# Patient Record
Sex: Male | Born: 1955 | Race: White | Hispanic: No | Marital: Married | Smoking: Former smoker
Health system: Southern US, Community
[De-identification: ages and names within clinical notes are randomized; demographics above are authoritative.]

---

## 2010-10-24 ENCOUNTER — Inpatient Hospital Stay (HOSPITAL_COMMUNITY)
Admission: EM | Admit: 2010-10-24 | Discharge: 2010-11-03 | DRG: 330 | Disposition: A | Discharge status: Home / Self Care | Payer: Self-pay | Attending: General Surgery | Admitting: General Surgery

## 2010-10-24 ENCOUNTER — Ambulatory Visit (INDEPENDENT_AMBULATORY_CARE_PROVIDER_SITE_OTHER): Payer: Self-pay

## 2010-10-24 ENCOUNTER — Inpatient Hospital Stay (INDEPENDENT_AMBULATORY_CARE_PROVIDER_SITE_OTHER)
Admission: RE | Admit: 2010-10-24 | Discharge: 2010-10-24 | Disposition: A | Discharge status: Home / Self Care | Payer: Self-pay | Source: Ambulatory Visit | Attending: Family Medicine | Admitting: Family Medicine

## 2010-10-24 ENCOUNTER — Emergency Department (HOSPITAL_COMMUNITY): Payer: Self-pay

## 2010-10-24 DIAGNOSIS — K5732 Diverticulitis of large intestine without perforation or abscess without bleeding: Principal | ICD-10-CM | POA: Diagnosis present

## 2010-10-24 DIAGNOSIS — K56609 Unspecified intestinal obstruction, unspecified as to partial versus complete obstruction: Secondary | ICD-10-CM

## 2010-10-24 DIAGNOSIS — D126 Benign neoplasm of colon, unspecified: Secondary | ICD-10-CM | POA: Diagnosis present

## 2010-10-24 DIAGNOSIS — K5669 Other intestinal obstruction: Secondary | ICD-10-CM | POA: Diagnosis present

## 2010-10-24 DIAGNOSIS — E876 Hypokalemia: Secondary | ICD-10-CM | POA: Diagnosis not present

## 2010-10-24 DIAGNOSIS — R109 Unspecified abdominal pain: Secondary | ICD-10-CM

## 2010-10-24 DIAGNOSIS — J209 Acute bronchitis, unspecified: Secondary | ICD-10-CM | POA: Diagnosis not present

## 2010-10-24 DIAGNOSIS — F17200 Nicotine dependence, unspecified, uncomplicated: Secondary | ICD-10-CM | POA: Diagnosis present

## 2010-10-24 LAB — COMPREHENSIVE METABOLIC PANEL
Albumin: 3.3 g/dL — ABNORMAL LOW (ref 3.5–5.2)
Alkaline Phosphatase: 61 U/L (ref 39–117)
BUN: 11 mg/dL (ref 6–23)
Calcium: 8.8 mg/dL (ref 8.4–10.5)
Creatinine, Ser: 0.74 mg/dL (ref 0.50–1.35)
GFR calc Af Amer: >90 mL/min (ref >90)
Glucose, Bld: 101 mg/dL — ABNORMAL HIGH (ref 70–99)
Total Protein: 6.5 g/dL (ref 6.0–8.3)

## 2010-10-24 LAB — POCT URINALYSIS DIP (DEVICE)
Glucose, UA: NEGATIVE mg/dL
Hgb urine dipstick: NEGATIVE
Nitrite: NEGATIVE
Specific Gravity, Urine: >=1.03 (ref 1.005–1.030)
Urobilinogen, UA: 1 mg/dL (ref 0.0–1.0)
pH: 6 (ref 5.0–8.0)

## 2010-10-24 LAB — LIPASE, BLOOD: Lipase: 54 U/L (ref 11–59)

## 2010-10-24 LAB — URINALYSIS, ROUTINE W REFLEX MICROSCOPIC
Bilirubin Urine: NEGATIVE
Leukocytes, UA: NEGATIVE
Nitrite: NEGATIVE
Specific Gravity, Urine: 1.02 (ref 1.005–1.030)
Urobilinogen, UA: 1 mg/dL (ref 0.0–1.0)

## 2010-10-24 LAB — DIFFERENTIAL
Eosinophils Relative: 1 % (ref 0–5)
Lymphocytes Relative: 9 % — ABNORMAL LOW (ref 12–46)
Monocytes Absolute: 0.7 10*3/uL (ref 0.1–1.0)
Monocytes Relative: 6 % (ref 3–12)
Neutro Abs: 9.7 10*3/uL — ABNORMAL HIGH (ref 1.7–7.7)

## 2010-10-24 LAB — CBC
HCT: 44.5 % (ref 39.0–52.0)
Hemoglobin: 15.7 g/dL (ref 13.0–17.0)
MCH: 32.4 pg (ref 26.0–34.0)
MCHC: 35.3 g/dL (ref 30.0–36.0)
RDW: 12.4 % (ref 11.5–15.5)

## 2010-10-24 MED ORDER — IOHEXOL 300 MG/ML  SOLN
100.0000 mL | Freq: Once | INTRAMUSCULAR | Status: AC | PRN
  Administered 2010-10-24: 100 mL via INTRAVENOUS

## 2010-10-25 ENCOUNTER — Other Ambulatory Visit: Payer: Self-pay | Admitting: Gastroenterology

## 2010-10-25 LAB — TYPE AND SCREEN: Antibody Screen: NEGATIVE

## 2010-10-25 LAB — BASIC METABOLIC PANEL
BUN: 9 mg/dL (ref 6–23)
GFR calc Af Amer: >90 mL/min (ref >90)
GFR calc non Af Amer: >90 mL/min (ref >90)
Potassium: 3.3 mEq/L — ABNORMAL LOW (ref 3.5–5.1)

## 2010-10-25 LAB — MAGNESIUM: Magnesium: 1.8 mg/dL (ref 1.5–2.5)

## 2010-10-25 LAB — PROTIME-INR: Prothrombin Time: 13.1 seconds (ref 11.6–15.2)

## 2010-10-25 LAB — OCCULT BLOOD, POC DEVICE: Fecal Occult Bld: NEGATIVE

## 2010-10-26 LAB — COMPREHENSIVE METABOLIC PANEL
AST: 9 U/L (ref 0–37)
Albumin: 2.8 g/dL — ABNORMAL LOW (ref 3.5–5.2)
Alkaline Phosphatase: 50 U/L (ref 39–117)
Chloride: 103 mEq/L (ref 96–112)
Potassium: 3.2 mEq/L — ABNORMAL LOW (ref 3.5–5.1)
Total Bilirubin: 0.9 mg/dL (ref 0.3–1.2)

## 2010-10-26 LAB — CBC
HCT: 41.1 % (ref 39.0–52.0)
Platelets: 232 10*3/uL (ref 150–400)
RDW: 12.4 % (ref 11.5–15.5)
WBC: 10.3 10*3/uL (ref 4.0–10.5)

## 2010-10-27 ENCOUNTER — Inpatient Hospital Stay (HOSPITAL_COMMUNITY): Payer: Self-pay

## 2010-10-28 ENCOUNTER — Inpatient Hospital Stay (HOSPITAL_COMMUNITY): Payer: Self-pay

## 2010-10-28 LAB — BASIC METABOLIC PANEL
Calcium: 8.5 mg/dL (ref 8.4–10.5)
GFR calc Af Amer: >90 mL/min (ref >90)
GFR calc non Af Amer: >90 mL/min (ref >90)
Potassium: 2.8 mEq/L — ABNORMAL LOW (ref 3.5–5.1)
Sodium: 139 mEq/L (ref 135–145)

## 2010-10-28 LAB — CBC
MCH: 31.7 pg (ref 26.0–34.0)
MCHC: 35.4 g/dL (ref 30.0–36.0)
Platelets: 245 10*3/uL (ref 150–400)

## 2010-10-28 LAB — CEA: CEA: 0.9 ng/mL (ref 0.0–5.0)

## 2010-10-29 ENCOUNTER — Inpatient Hospital Stay (HOSPITAL_COMMUNITY): Payer: Self-pay

## 2010-10-29 LAB — BASIC METABOLIC PANEL
Calcium: 8.8 mg/dL (ref 8.4–10.5)
GFR calc Af Amer: >90 mL/min (ref >90)
GFR calc non Af Amer: >90 mL/min (ref >90)
Glucose, Bld: 67 mg/dL — ABNORMAL LOW (ref 70–99)
Potassium: 3.2 mEq/L — ABNORMAL LOW (ref 3.5–5.1)
Sodium: 138 mEq/L (ref 135–145)

## 2010-10-29 LAB — MAGNESIUM: Magnesium: 1.8 mg/dL (ref 1.5–2.5)

## 2010-10-29 MED ORDER — IOHEXOL 300 MG/ML  SOLN
450.0000 mL | Freq: Once | INTRAMUSCULAR | Status: AC | PRN
  Administered 2010-10-29: 450 mL

## 2010-10-29 NOTE — H&P (Signed)
NAME:  Ronald Morse, Ronald Morse NO.:  [id number]  MEDICAL RECORD NO.:  [id number]  LOCATION:  URG                          FACILITY:  [facility]  PHYSICIAN:  Carlota Raspberry, MD         DATE OF BIRTH:  12-Mar-1955  DATE OF ADMISSION:  10/24/2010 DATE OF DISCHARGE:                             HISTORY & PHYSICAL   PRIMARY CARE PHYSICIAN:  Melba Coon with [facility].  CHIEF COMPLAINT:  Abdominal pain.  No bowel movements.  HISTORY OF PRESENT ILLNESS:  A 55 year old male with no major past medical history presents with 1-2 months of lower abdominal pain radiating into his left testicle associated with constipation.  Symptoms have been going on for the past month, however during a trip out of town for the past 10 days, the patient stopped having flatus for the past 3 or 4 days and has not had a bowel movement for the past week associated with abdominal bloating and lower abdominal pain.  He was sent initially to urgent care who then sent him over here where his workup has revealed a CT abdomen and pelvis with contrast which showed severe constipation with an abrupt caliber change in the sigmoid colon with associated marked thickening of his sigmoid colon.  These are concerning for neoplasm with associated colonic obstruction but no evidence for metastatic disease identified.  His workup in the ED was otherwise fairly unremarkable.  He was given several rounds of Dilaudid and Zofran.  ED discussed with Dr. Bosie Clos in GI who planned a possible sigmoidoscopy in the a.m. and recommended keeping him NPO.  Surgery was also consulted and Dr. Lindie Spruce has just finished seeing the patient and his recommendation is for flex sigmoidoscopy in the morning.  The patient has been admitted to [facility] for further management.   In discussion with the patient and his wife and several children and granddaughter who are in the room, they relate the above history and also he states that he is having no  fevers, chills, night sweats.  No weight loss.  No nausea.  No vomiting.  No cardiopulmonary symptoms.  He is having a runny nose.  REVIEW OF SYSTEMS:  Otherwise negative.  PAST MEDICAL HISTORY:  Otherwise unremarkable.  He has been fairly healthy and has never been in the hospital until today.  MEDICATIONS:  He takes no medications on a daily basis.  ALLERGIES:  He has no known drug allergies or allergies to anything.  SOCIAL HISTORY:  He lives in [place] with his wife of 38 years.  He has been fairly independent and healthy.  He does smoke 1.5 packs per day for the last 40 years but does not do any alcohol or any drugs.  He has 2 daughters and a son.  FAMILY HISTORY:  His mother has had a couple strokes but has had no cancers.  Father has hypertension but has had no cancers.  Family history otherwise unremarkable.  PHYSICAL EXAMINATION:  VITAL SIGNS:  Blood pressure 113/72 ranging 113- 148, pulse 78, respirations 16, 95% on room air. GENERAL:  He is a tall, large but not obese gentleman who appears moderately discomforted by his abdomen and also begin  vomiting brown vomit into a bag. HEENT:  His pupils are equal, round, reactive to light.  His extraocular muscles intact.  His sclerae are clear.  His mouth is moist and normal appearing.  There are no oropharyngeal or buccal lesions noted. LUNGS:  Clear to auscultation bilaterally.  No wheezes, crackles, rales, or rhonchi. HEART:  Regular rate and rhythm with no murmurs or gallops.  There are no adventitious heart sounds. ABDOMEN:  His abdomen is nonperitoneal.  but it is a bit firm.  It is not particularly tender to palpation.  There are no bowel sounds. EXTREMITIES:  Warm, well perfused with no cyanosis or clubbing.  There is no bilateral lower extremity edema. NEUROLOGIC:  He is intact with no focal neurological deficits noted.  He is able to relate his history only limited by some pain.  He is moving his extremities  spontaneously and is able to sit up in bed for me to auscultate his lungs.  There are no focal neurological deficits noted.  LABORATORY WORK:  White blood cell count is 1.6 with 84% neutrophils and 9% lymphocytes, otherwise normal.  Hematocrit is 44.5 with an MCV of 92, platelets 239.  Chemistry panel is normal with renal functions of 11 and 0.74 with no prior baseline.  LFTs are normal.  Albumin 3.3, calcium 8.8, lipase 54.  UA shows 15 ketonuria and small bilirubinuria otherwise negative.   Radiography of abdominal plain film was nonspecific, nonobstructive bowel gas pattern.  CT of abdomen and pelvis with contrast showed severe constipation with an abrupt caliber change in the colon in the proximal sigmoid.  Findings were worrisome for neoplasm with associated colonic obstruction but no evidence of metastatic disease is identified.  There is also walls of the sigmoid colon are markedly thickened.  IMPRESSION:  This is a 55 year old healthy male who presents with 1 month of abdominal pain and 1 week of obstructive symptoms who is found to have severe constipation with an abrupt caliber change in his colon in the proximal sigmoid with findings worrisome for neoplasm but no evidence of metastases.  Large bowel obstruction.  Surgery and GI have been consulted and Surgery has already seen him with the recommendation for a sigmoidoscopy with possible biopsies.  Apparently, Dr. Bosie Clos and GI is aware of the patient.  We will keep him NPO and give him maintenance IV fluids.  We will get a preop chest x-ray, EKG, coags and type and screen.  He has been offered an NG a couple times in the emergency room by the ED staff and by me.  However he is refusing at present.  His pain is overall well controlled and he states that most recently 0/10 after some pain meds and he is not persistently vomiting.  Therefore I think that we can hold off on this for now but I have offered it to him.  We  will give him antiemetics and pain control.  Fluid electrolytes and nutrition.  We will run continuous 100 mL of normal saline per hour and keep him NPO.  IV access.  He has 1 peripheral IV.  Prophylaxis.  Subcutaneous heparin unless he is ambulatory t.i.d., Zofran, Phenergan, Tylenol, oxycodone for pain, use Dilaudid for breakthrough.  Code status.  He is full code.  I discussed this with him and his wife.  The patient will be admitted to a regular bed under Team 7.          ______________________________ Carlota Raspberry, MD     EB/MEDQ  D:  10/24/2010  T:  10/24/2010  Job:  [id number]  Electronically Signed by Carlota Raspberry MD on 10/29/2010 01:50:57 AM

## 2010-10-30 ENCOUNTER — Other Ambulatory Visit (INDEPENDENT_AMBULATORY_CARE_PROVIDER_SITE_OTHER): Payer: Self-pay | Admitting: General Surgery

## 2010-10-30 DIAGNOSIS — K5732 Diverticulitis of large intestine without perforation or abscess without bleeding: Secondary | ICD-10-CM

## 2010-10-30 HISTORY — PX: COLON SURGERY: SHX602

## 2010-10-30 LAB — DIFFERENTIAL
Basophils Absolute: 0 10*3/uL (ref 0.0–0.1)
Eosinophils Absolute: 0.2 10*3/uL (ref 0.0–0.7)
Eosinophils Relative: 2 % (ref 0–5)
Lymphocytes Relative: 21 % (ref 12–46)
Monocytes Absolute: 0.6 10*3/uL (ref 0.1–1.0)

## 2010-10-30 LAB — BASIC METABOLIC PANEL
BUN: 9 mg/dL (ref 6–23)
CO2: 23 mEq/L (ref 19–32)
Calcium: 8.6 mg/dL (ref 8.4–10.5)
Chloride: 102 mEq/L (ref 96–112)
Creatinine, Ser: 0.81 mg/dL (ref 0.50–1.35)

## 2010-10-30 LAB — SURGICAL PCR SCREEN
MRSA, PCR: NEGATIVE
Staphylococcus aureus: NEGATIVE

## 2010-10-30 LAB — CBC
HCT: 41.3 % (ref 39.0–52.0)
MCHC: 35.8 g/dL (ref 30.0–36.0)
MCV: 87.7 fL (ref 78.0–100.0)
Platelets: 277 10*3/uL (ref 150–400)
RDW: 12.1 % (ref 11.5–15.5)
WBC: 7 10*3/uL (ref 4.0–10.5)

## 2010-10-30 LAB — TYPE AND SCREEN: Antibody Screen: NEGATIVE

## 2010-10-31 LAB — CULTURE, BLOOD (ROUTINE X 2)
Culture  Setup Time: 201210190310
Culture: NO GROWTH

## 2010-10-31 NOTE — Consult Note (Signed)
NAME:  Ronald, Morse NO.:  [id number]  MEDICAL RECORD NO.:  [id number]  LOCATION:  URG                          FACILITY:  [facility]  PHYSICIAN:  Cherylynn Ridges, M.D.    DATE OF BIRTH:  09/04/1955  DATE OF CONSULTATION:  10/24/2010 DATE OF DISCHARGE:                                CONSULTATION   Dear Dr. Clovis Riley:  Thank you very much for having me see this 55 year old male, [name], with abdominal discomfort and distention starting approximately 3 weeks ago, but worsening over the last week with complete obstipation and no flatus for the past 3-4 days.  He has had increasing abdominal cramping.  He has had no gas for at least the past 3-4 days.  He has taken Aleve for the discomfort which works on occasion, but no other medications.  PAST MEDICAL HISTORY:  Unremarkable.  PAST SURGICAL HISTORY:  He has had no prior surgery.  MEDICATIONS:  He takes no medications on a regular basis, although he has been taking Aleve for this current pain.  ALLERGIES:  He has no known drug allergies.  PRIMARY CARE DOCTOR:  L. Lupe Carney, MD who saw the patient approximately 1 month ago, at which time he was not quite as distended and having as many symptoms as he is now, but workup at that time was negative.  REVIEW OF SYSTEMS:  He has had no bowel movements for at least a week. No gas in 3-4 days.  He has had no fevers and chills.  He has had no nausea, but did throw up last night x1.  SOCIAL HISTORY:  He is a pack and half day smoker for the past 40 years. He does not drink any alcohol or take any illicit drugs.  He is employed as a Corporate investment banker and was working up until about a week ago when he took vacation that was unrelated to his current symptoms.  PHYSICAL EXAMINATION:  GENERAL:  He is a well-nourished gentleman who appears to be very uncomfortable, but not in severe distress. HEENT:  He is normocephalic and atraumatic.  He has no  cervical adenopathy.  There are no supraclavicular nodes. LUNGS:  Clear to auscultation. CARDIAC:  He has got a regular rhythm, somewhat bradycardic when I saw him, but it sort of goes up and down and 12-lead EKG is pending.  His documented heart rate is 73.  There is no lift or heave. ABDOMEN:  Distended and you can almost feel the bowel loops on the anterior abdominal wall with hypoactive bowel sounds.  No acute areas of tenderness, just mild diffuse tenderness. RECTAL:  Performed prior to my coming and did not show any gross blood and was guaiac negative, was done at the Urgent Care.  There was no stool in the vault. NEUROLOGIC:  Cranial nerves II-XII were grossly intact.  LABORATORY STUDIES:  His white count is 11.6, hemoglobin of 15.7, hematocrit of 44.5, platelets of 239,000.  Electrolytes were within normal limits.  He is not acidotic based on electrolytes.  Lactate level is pending.  I have reviewed the patient's CT scan and films and it does show a very high-grade large bowel  obstruction.  It appears as though the ileocecal valve is competent and therefore there is not much gaseous distention of the small bowel.  I do not believe an NG tube in this scenario will be helpful.  In face of a large bowel obstruction of this sort, there appears to be on CT scan an obstructing process in the sigmoid colon, somewhat more elongated than just a discrete mass it seems to me with some thickening of the distal sigmoid colon, but a clear area of large bowel obstruction.  Gastroenterology has been called to consult on the patient and I think a flexible sigmoidoscopy to decompress and perhaps to get some diagnosis would be helpful.  If the patient should worsen prior to that, then he may require emergency surgery including a colostomy and a possible colectomy.  I have discussed these concerns with the gastroenterologist on-call, and the plan now is just control and hydrate him, and  then consider flexible sigmoidoscopy tomorrow.     Cherylynn Ridges, M.D.     JOW/MEDQ  D:  10/24/2010  T:  10/24/2010  Job:  [id number]  cc:   L. Lupe Carney, M.D.  Electronically Signed by Jimmye Norman M.D. on 10/31/2010 04:36:48 PM

## 2010-10-31 NOTE — Op Note (Signed)
NAME:  Ronald Morse, Ronald Morse NO.:  [id number]  MEDICAL RECORD NO.:  [id number]  LOCATION:  5158                         FACILITY:  [facility]  PHYSICIAN:  Cherylynn Ridges, M.D.    DATE OF BIRTH:  1955/07/01  DATE OF PROCEDURE:  10/30/2010 DATE OF DISCHARGE:                              OPERATIVE REPORT   PREOPERATIVE DIAGNOSIS:  Sigmoid stricture.  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  PROCEDURE:  Sigmoid colectomy.  SURGEON:  Cherylynn Ridges, MD  ASSISTANT:  Almond Lint, MD  ANESTHESIA:  General endotracheal.  ESTIMATED BLOOD LOSS:  200 mL.  COMPLICATIONS:  None.  CONDITION:  Stable.  FINDINGS:  Stricture in the proximal to mid sigmoid colon without evidence of malignancy.  There was no evidence of any liver or other disease.  INDICATION FOR OPERATION:  The patient was admitted on the 18th with abdominal bloating, distention, abdominal pain with a larger colon obstruction.  Endoscopy with biopsy failed to demonstrate malignancy. Barium Gastrografin study demonstrated an apple core lesion in the sigmoid colon was concerning for malignancy, however, symptomatically his CEA level was normal and there was nothing else to indicate malignancy.  OPERATION:  The patient was taken to the operating room, placed on table in supine position.  After an adequate general endotracheal anesthetic was administered, he was placed in lithotomy position and prepped and draped in usual sterile manner exposing both the perineal area sterilely and the abdomen.  After proper time-out was performed identifying the patient and procedure to be performed, an infraumbilical midline incision was made using #10 blade and taken down to and through the midline fascia.  Once we entered the peritoneal cavity, we opened the incision fully and taking care not to injure or get into the bladder inferiorly.  About 4 retractor was put in place,  the patient was placed in Trendelenburg position.  We could palpate the strictured and hardened sigmoid colon area in the left pelvic area.  With the diligence and careful dissection, we were able to dissect away this strictured and hardened mass in the sigmoid colon away from the left lateral wall staying close to the colon.  Initially, we felt those likely represent an inflammatory process, we are now concerned about malignancy.  Later on we did palpate the liver and the stomach and other structures found there to be no evidence of any malignancy.  We mobilized the sigmoid colon proximally and distally, came across the proximal sigmoid colon and the distal sigmoid colon using a GIA 75 stapler.  We removed about an 8-10 segment of colon taking it off the field, sent to pathology for frozen section.  The gross description of the mass indicated likely inflammatory diverticular disease, and therefore no frozen section was done.  A hand-sewn 2 layered anastomosis between the 2 ends of the sigmoid colon was done using silk and Lembert stitches, 3-0 Vicryl, internalfull-thickness stitches and canal stitches.  The anterior wall was also sutured together using Lembert.  All counts were correct.  We obtained adequate hemostasis in the pelvis.  Once this was done, we irrigated and changed our gloves and closed.  We closed the midline  fascia using running looped #1 PDS suture.  The skin was closed using stainless steel staples.  All needle counts, sponge counts, and instrument counts were correct.     Cherylynn Ridges, M.D.     JOW/MEDQ  D:  10/30/2010  T:  10/30/2010  Job:  [id number]  Electronically Signed by Jimmye Norman M.D. on 10/31/2010 04:36:52 PM

## 2010-10-31 NOTE — Consult Note (Signed)
  NAME:  NOAH, LEMBKE NO.:  [id number]  MEDICAL RECORD NO.:  [id number]  LOCATION:  URG                          FACILITY:  [facility]  PHYSICIAN:  Bernette Redbird, M.D.   DATE OF BIRTH:  [birth year]  DATE OF CONSULTATION: DATE OF DISCHARGE:  10/24/2010                                CONSULTATION   Dr. Kaylyn Layer of the [facility] asked [place] to see this 55 year old gentleman because of colonic obstruction and concerns of colon cancer.  The patient has enjoyed good health in the past.  Recently has had some lower abdominal pain with radiation to the testicle, more recently abdominal distention and absence of bowel movements and flatus which came to a head during a recent trip.  He was sent to the emergency room where CT scan showed significant fecal obstipation with evidence of a stricture transition point in the sigmoid region.  So sigmoidoscopic evaluation has been requested to try to verify the presence or absence of a tumor in that area.  Of note, there is no evidence of metastatic disease on the patient's CT.  PAST MEDICAL HISTORY:  No allergies.  Outpatient medications:  None. Chronic medical illnesses:  None.  OPERATIONS:  None.  HABITS:  He does smoke but is a nondrinker.  FAMILY HISTORY:  Negative for colon cancer or other GI illnesses other than ulcers in his father and possibly gallbladder disease in his daughter.  SOCIAL HISTORY:  Married, lives with his wife, works as a Music therapist.  REVIEW OF SYSTEMS:  Negative for prodromal upper or lower tract symptoms apart from the present illness.  At baseline, he normally has 2 bowel movements a day.  He has not noticed rectal bleeding.  He did have some vomiting and retching prior to admission.  PHYSICAL EXAMINATION:  GENERAL:  A stocky pleasant healthy-appearing Caucasian male. VITALS:  Normal. EYES: Anicteric, no pallor. CHEST:  Clear. HEART:  Normal. ABDOMEN:  Without overt organomegaly, guarding,  mass effect, or tenderness but there is distention and tympany and the abdomen is quite soft.  IMPRESSION:  Stricture in the sigmoid region leading to colonic obstruction.  Unclear whether this is due to cancer or some other cause.  PLAN:  Proceed to sigmoidoscopic evaluation, the nature, purpose, and risks of which have been reviewed with the patient and he is agreeable.          ______________________________ Bernette Redbird, M.D.     RB/MEDQ  D:  10/25/2010  T:  10/26/2010  Job:  [id number]  cc:   L. Lupe Carney, M.D.  Electronically Signed by Bernette Redbird M.D. on 10/31/2010 02:49:13 PM

## 2010-11-01 LAB — BASIC METABOLIC PANEL
BUN: 4 mg/dL — ABNORMAL LOW (ref 6–23)
Calcium: 8.9 mg/dL (ref 8.4–10.5)
Creatinine, Ser: 0.69 mg/dL (ref 0.50–1.35)
GFR calc Af Amer: >90 mL/min (ref >90)
GFR calc non Af Amer: >90 mL/min (ref >90)

## 2010-11-01 LAB — CBC
HCT: 43.6 % (ref 39.0–52.0)
MCHC: 35.6 g/dL (ref 30.0–36.0)
MCV: 89 fL (ref 78.0–100.0)
Platelets: 238 10*3/uL (ref 150–400)
RDW: 12.6 % (ref 11.5–15.5)

## 2010-11-02 LAB — BASIC METABOLIC PANEL
Calcium: 9.3 mg/dL (ref 8.4–10.5)
Creatinine, Ser: 0.65 mg/dL (ref 0.50–1.35)
GFR calc Af Amer: >90 mL/min (ref >90)
GFR calc non Af Amer: >90 mL/min (ref >90)

## 2010-11-03 LAB — BASIC METABOLIC PANEL
Calcium: 9.7 mg/dL (ref 8.4–10.5)
GFR calc Af Amer: >90 mL/min (ref >90)
GFR calc non Af Amer: >90 mL/min (ref >90)
Glucose, Bld: 108 mg/dL — ABNORMAL HIGH (ref 70–99)
Sodium: 137 mEq/L (ref 135–145)

## 2010-11-06 ENCOUNTER — Telehealth (INDEPENDENT_AMBULATORY_CARE_PROVIDER_SITE_OTHER): Payer: Self-pay | Admitting: General Surgery

## 2010-11-11 ENCOUNTER — Ambulatory Visit (INDEPENDENT_AMBULATORY_CARE_PROVIDER_SITE_OTHER): Payer: Self-pay

## 2010-11-11 DIAGNOSIS — Z4802 Encounter for removal of sutures: Secondary | ICD-10-CM

## 2010-11-11 NOTE — Progress Notes (Signed)
Patient came in for removal of his remaining staples. Incision looks nicely healed. I took staples out and put steri strips on.

## 2010-11-12 NOTE — Discharge Summary (Signed)
NAME:  Ronald Morse, Ronald Morse NO.:  [id number]  MEDICAL RECORD NO.:  [id number]  LOCATION:                                 FACILITY:  PHYSICIAN:  Ardeth Sportsman, MD     DATE OF BIRTH:  08/07/1955  DATE OF ADMISSION: DATE OF DISCHARGE:                              DISCHARGE SUMMARY   ADMISSION DIAGNOSES: 1. Peri-abdominal pain. 2. High-grade bowel obstruction, with obstipation.  DISCHARGE DIAGNOSIS:  Sigmoid stricture.  PROCEDURES: 1. CT scan of the abdomen October 24, 2010. 2. Flexible sigmoidoscopy and polypectomy October 25, 2010, Dr.     Matthias Hughs. 3. Sigmoid colectomy October 30, 2010, Dr. Jimmye Norman.  BRIEF HISTORY:  The patient is a 55 year old gentleman who is admitted by the hospitalist with abdominal pain and distention for 3-4 weeks.   He has become completely constipated.  HOSPITAL COURSE: He was seen by the hospitalist and admitted.  CT scan on admission showed severe constipation with abrupt caliber change in the colon.  In the proximal sigmoid, it was worrisome for a colonic obstruction.  There was no evidence of metastatic disease.  The patient was admitted, was seen and initially medically managed.  He underwent a GI consult on October 25, 2010, and was scheduled for a flexible sigmoidoscopy later that day.  He was found to have some polyps, had a stricture of uncertain etiology.  He was treated medically during the interim with laxatives with some relief of his constipation.  The pathology came back as; benign. He was prepped and subsequently taken to the OR on October 30, 2010, by Dr. Lindie Spruce for a sigmoid colectomy. Pathology on October 25, 2010, showed colon of the sigmoid with no dysplasia or malignancy, colon polyps.  Proximal rectum were hyperplastic, but negative for dysplasia or malignancy.  The pathology obtained after his colectomy was also diverticular disease, acute on chronic, subserosal inflammation with no tumor seen.   Postoperatively, the patient has actually done quite well.  His diet was advanced up to full liquids.  On November 02, 2010, and when I saw him on the morning of November 03, 2010, he was anxious to go home.  He was still on a PCA and he was not using it.  We discontinued that.  We placed him on oral pain meds, gave him a soft diet.  He had, had a bowel movement.  His labs were normal and it was Dr. Gordy Savers opinion he may be discharged home later that day.  DISCHARGE MEDICATIONS: 1. Tylenol 650 p.o. q.4 h. p.r.n. 2. Ibuprofen 4-800 mg q.6 p.r.n. 3. Nicotine patch. 4. Oxycodone 5 mg IR q.4 p.r.n.  He is to followup with Dr. Lindie Spruce in 10-14 days.  DISCHARGE ACTIVITY:  Light to moderate.  He has had to have his sutures removed by Dr. Michaell Cowing.  It will have to be removed in the office.  DISCHARGE ACTIVITY:  Light to moderate, no strenuous activity for 3 weeks.  CONDITION ON DISCHARGE:  Improved.  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     [name], P.A.   ______________________________ Ardeth Sportsman, MD    WDJ/MEDQ  D:  11/03/2010  T:  11/03/2010  Job:  [id number]  cc:   Bernette Redbird, M.D.  Electronically Signed by Sherrie George P.A. on 11/04/2010 02:52:20 PM Electronically Signed by Karie Soda MD on 11/12/2010 08:55:08 PM

## 2010-11-19 ENCOUNTER — Encounter (INDEPENDENT_AMBULATORY_CARE_PROVIDER_SITE_OTHER): Payer: Self-pay | Admitting: General Surgery

## 2010-11-19 ENCOUNTER — Ambulatory Visit (INDEPENDENT_AMBULATORY_CARE_PROVIDER_SITE_OTHER): Payer: Self-pay | Admitting: General Surgery

## 2010-11-19 VITALS — BP 136/98 | HR 64 | Temp 96.8°F | Resp 18 | Ht 68.0 in | Wt 161.1 lb

## 2010-11-19 DIAGNOSIS — Z09 Encounter for follow-up examination after completed treatment for conditions other than malignant neoplasm: Secondary | ICD-10-CM | POA: Insufficient documentation

## 2010-11-19 NOTE — Patient Instructions (Signed)
Try to keep the area of the incision covered so that the clothing does not irritate the area where the suture is

## 2010-11-19 NOTE — Progress Notes (Signed)
HPI The patient is actually doing well status post sigmoid colectomy for diverticular stricture. He states that he's had a couple of very hard bowel movements with difficulty passing his stools. I have advised him to take a stool softer on a daily basis and also increase his fluid input.  PE Along his midline incision there is a slight redness not necessarily inflammatory erythema but an area where you can feel the PDS suture underneath the skin. This likely represents an area of irritability that could become more of a problem if he continues to have something rubbing on a constant  Studiy review There are no studies to review.  Assessment Doing well status post sigmoid colectomy for diverticular stricture.  Plan Because of the erythema around the suture in the midportion of the wound across the difficulty he's had passing stools otherwise the patient to take Colace twice a day and to increase his fluid input.  Right to see the patient in 3 weeks in order to assess his abdominal wound to make sure that the irritability has not changed into an infection.

## 2010-12-24 ENCOUNTER — Ambulatory Visit (INDEPENDENT_AMBULATORY_CARE_PROVIDER_SITE_OTHER): Payer: Self-pay | Admitting: General Surgery

## 2010-12-24 ENCOUNTER — Encounter (INDEPENDENT_AMBULATORY_CARE_PROVIDER_SITE_OTHER): Payer: Self-pay | Admitting: General Surgery

## 2010-12-24 VITALS — BP 124/72 | HR 68 | Temp 97.8°F | Resp 18 | Ht 68.0 in | Wt 177.2 lb

## 2010-12-24 DIAGNOSIS — Z09 Encounter for follow-up examination after completed treatment for conditions other than malignant neoplasm: Secondary | ICD-10-CM

## 2010-12-24 NOTE — Progress Notes (Signed)
HPI The patient is doing well.  PE His midline incision is healed well with no evidence of infection. He still has some tenderness along both sides of the incision  Studiy review None  Assessment Overall status post colectomy with primary anastomosis.  Plan Return to see me on a p.r.n. basis

## 2019-08-16 ENCOUNTER — Ambulatory Visit
Admission: RE | Admit: 2019-08-16 | Discharge: 2019-08-16 | Disposition: A | Discharge status: Home / Self Care | Payer: No Typology Code available for payment source | Source: Ambulatory Visit | Attending: Family Medicine | Admitting: Family Medicine

## 2019-08-16 ENCOUNTER — Other Ambulatory Visit: Payer: Self-pay | Admitting: Family Medicine

## 2019-08-16 DIAGNOSIS — M542 Cervicalgia: Secondary | ICD-10-CM

## 2021-02-19 IMAGING — CR DG CERVICAL SPINE 2 OR 3 VIEWS
4 series · 4 of 4 positions shown · non-contrast
Comparison: None.

CLINICAL DATA: Cervicalgia with right-sided radicular symptoms

EXAM:
CERVICAL SPINE - 2-3 VIEW

[w c-spine lat]
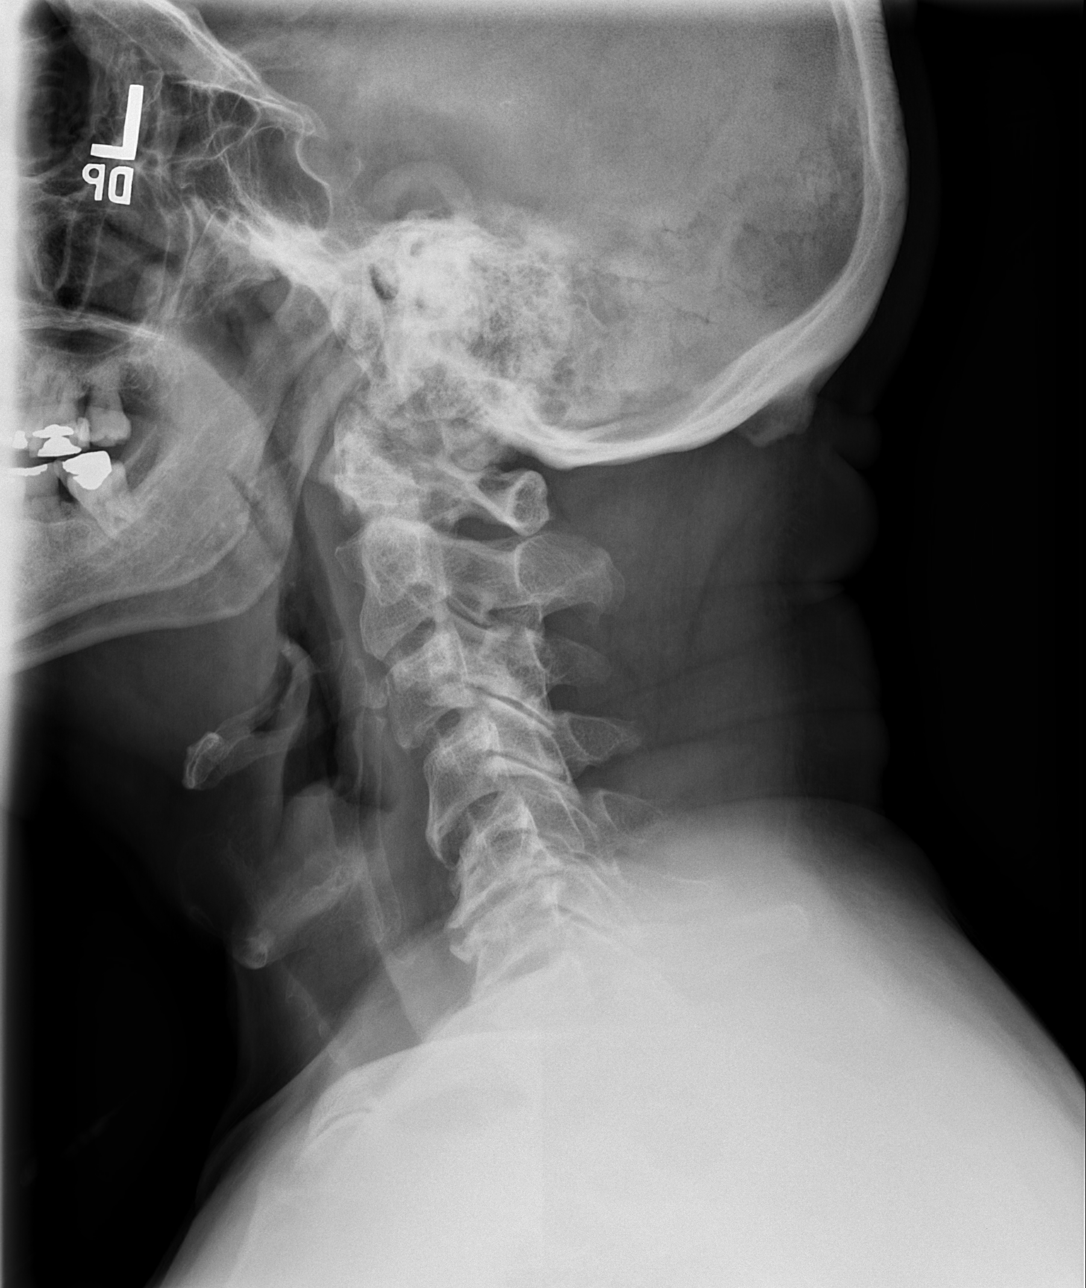

[w c-spine a.p. *]
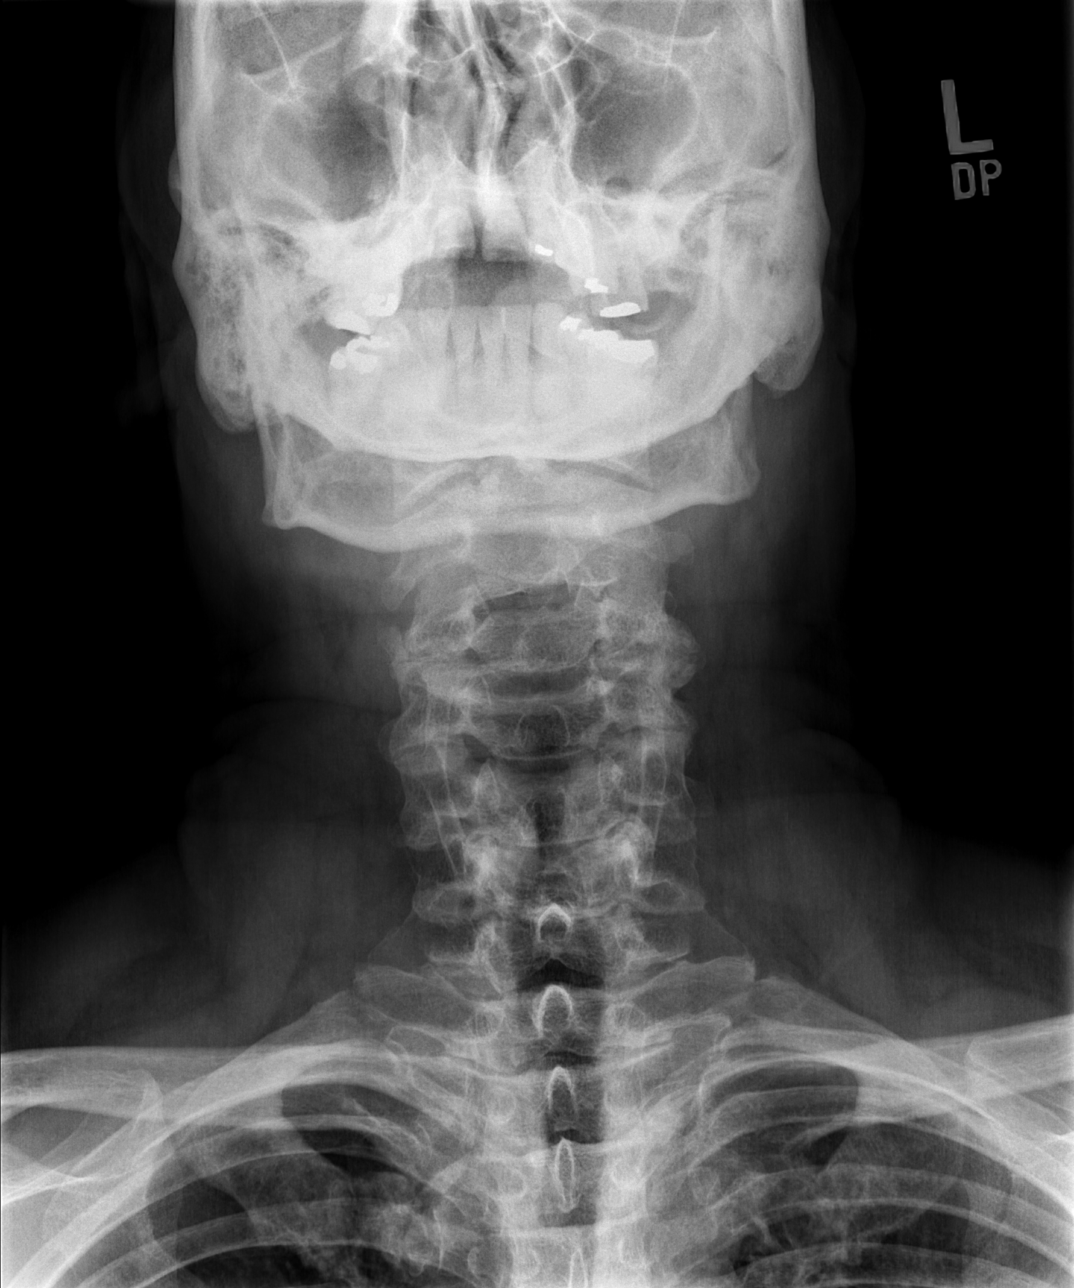

[w c-spine odontoid *]
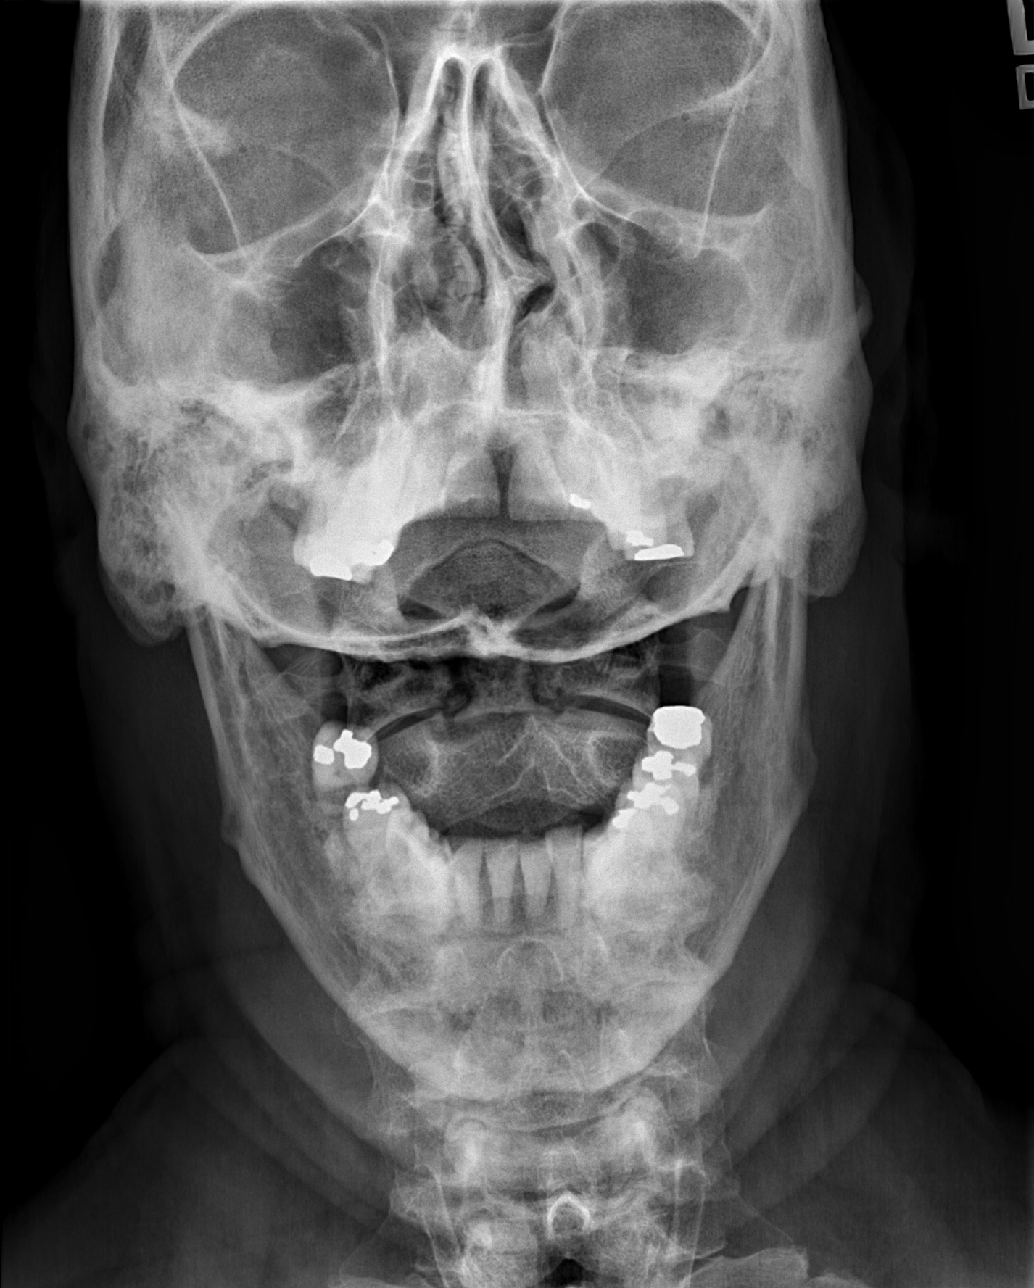

[w swimmers view *]
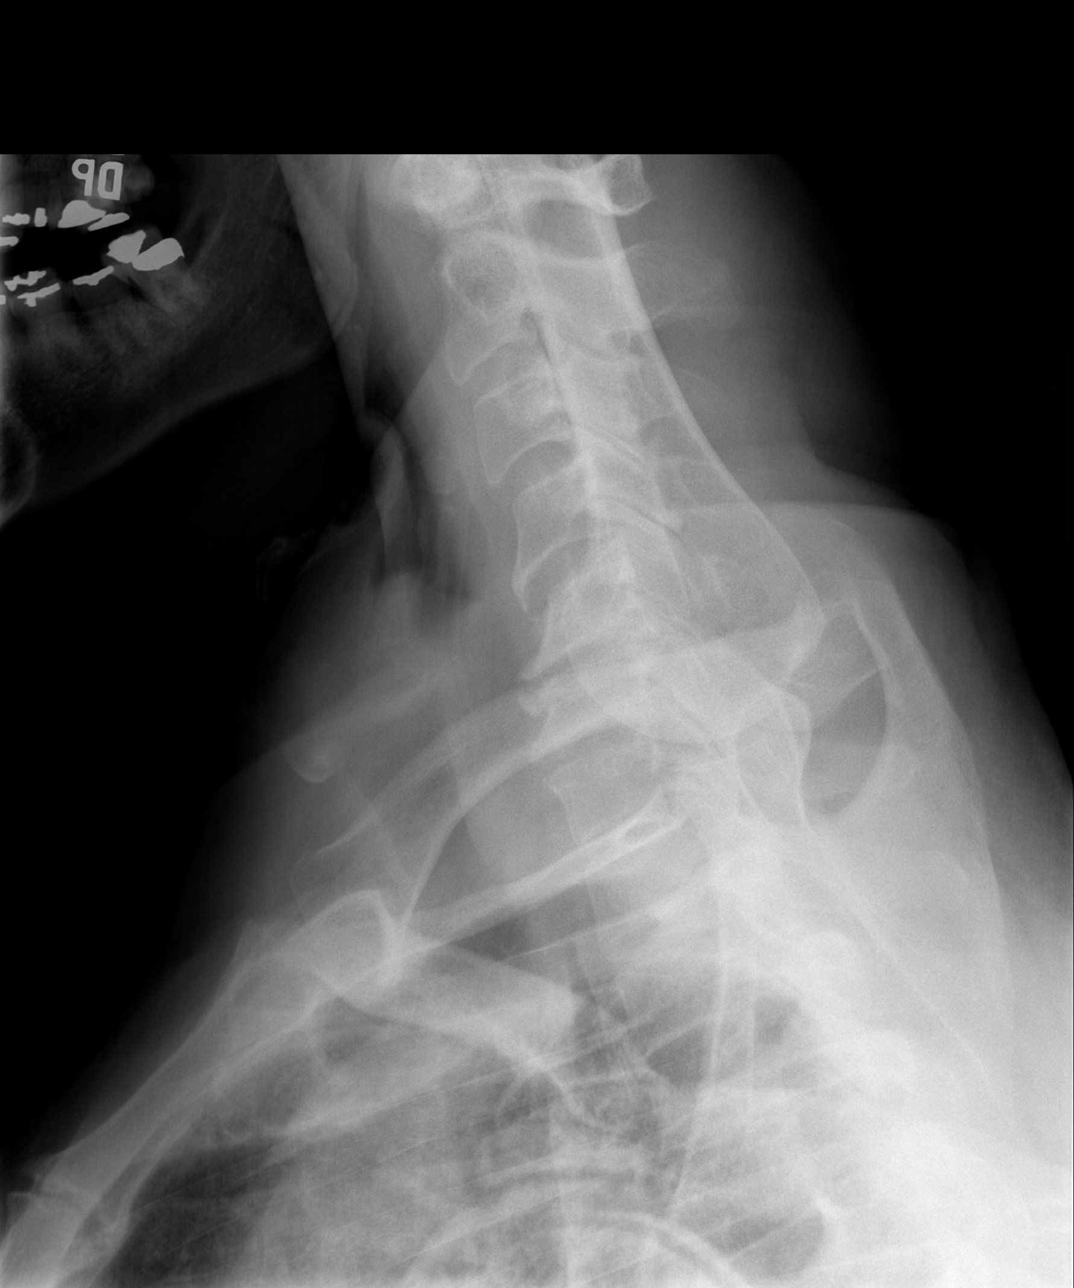

[4 of 4 positions shown; findings below may reference images not displayed]

FINDINGS: Frontal, lateral, and open-mouth odontoid images were obtained.
There is no fracture or spondylolisthesis. Prevertebral soft tissues
and predental space regions are normal. There is severe disc space
narrowing at C5-6 with moderately severe disc space narrowing at
C6-7. Milder disc space narrowing is noted at C4-5 posteriorly.
There are prominent anterior osteophytes at C4, C5, and C6. No
erosive changes. Lung apices are clear.
IMPRESSION: Osteoarthritic change, most severe at C5-6 and C6-7. No fracture or
spondylolisthesis.

## 2022-03-07 ENCOUNTER — Ambulatory Visit
Admission: EM | Admit: 2022-03-07 | Discharge: 2022-03-07 | Discharge status: Against Medical Advice | Payer: Medicare Other

## 2022-03-07 ENCOUNTER — Other Ambulatory Visit: Payer: Self-pay

## 2022-03-07 ENCOUNTER — Emergency Department (HOSPITAL_BASED_OUTPATIENT_CLINIC_OR_DEPARTMENT_OTHER)
Admission: EM | Admit: 2022-03-07 | Discharge: 2022-03-07 | Disposition: A | Discharge status: Home / Self Care | Payer: Medicare Other | Attending: Emergency Medicine | Admitting: Emergency Medicine

## 2022-03-07 ENCOUNTER — Encounter (HOSPITAL_BASED_OUTPATIENT_CLINIC_OR_DEPARTMENT_OTHER): Payer: Self-pay

## 2022-03-07 DIAGNOSIS — R1031 Right lower quadrant pain: Secondary | ICD-10-CM | POA: Diagnosis present

## 2022-03-07 DIAGNOSIS — M62830 Muscle spasm of back: Secondary | ICD-10-CM | POA: Diagnosis not present

## 2022-03-07 LAB — URINALYSIS, ROUTINE W REFLEX MICROSCOPIC
Bilirubin Urine: NEGATIVE
Glucose, UA: NEGATIVE mg/dL
Hgb urine dipstick: NEGATIVE
Ketones, ur: NEGATIVE mg/dL
Leukocytes,Ua: NEGATIVE
Nitrite: NEGATIVE
Specific Gravity, Urine: 1.02 (ref 1.005–1.030)
pH: 6 (ref 5.0–8.0)

## 2022-03-07 MED ORDER — TIZANIDINE HCL 4 MG PO CAPS: 4.0000 mg | ORAL_CAPSULE | Freq: Three times a day (TID) | ORAL | 0 refills | Status: AC | PRN

## 2022-03-07 MED ORDER — LIDOCAINE 5 % EX PTCH
1.0000 | MEDICATED_PATCH | CUTANEOUS | Status: DC
  Administered 2022-03-07: 1 via TRANSDERMAL
  Filled 2022-03-07: qty 1

## 2022-03-07 MED ORDER — ACETAMINOPHEN 500 MG PO TABS: 500.0000 mg | ORAL_TABLET | Freq: Four times a day (QID) | ORAL | 0 refills | Status: DC | PRN

## 2022-03-07 MED ORDER — LIDOCAINE 4 % EX PTCH: 1.0000 | MEDICATED_PATCH | Freq: Two times a day (BID) | CUTANEOUS | 0 refills | Status: AC

## 2022-03-07 MED ORDER — NAPROXEN 250 MG PO TABS
375.0000 mg | ORAL_TABLET | Freq: Once | ORAL | Status: AC
  Administered 2022-03-07: 375 mg via ORAL
  Filled 2022-03-07: qty 2

## 2022-03-07 MED ORDER — NAPROXEN 375 MG PO TABS: 375.0000 mg | ORAL_TABLET | Freq: Two times a day (BID) | ORAL | 0 refills | Status: AC

## 2022-03-07 MED ORDER — TIZANIDINE HCL 2 MG PO TABS
4.0000 mg | ORAL_TABLET | Freq: Once | ORAL | Status: AC
  Administered 2022-03-07: 4 mg via ORAL
  Filled 2022-03-07: qty 2

## 2022-03-07 NOTE — ED Provider Notes (Signed)
[facility] Provider Note   CSN: [id number] Arrival date & time: 03/07/22  1712     History  Chief Complaint  Patient presents with   Flank Pain    MCNEAL MILLET is a 67 y.o. male.  HPI    67 year old male comes in with chief complaint of right-sided flank pain.  Patient states that his pain started about a week ago.  Pain is triggered by any kind of movement/activity.  When patient is still, he is pain-free.  He denies any associated cough, UTI-like symptoms, shortness of breath, URI-like symptoms, blood in the urine, blood in the stool or severe constipation.  Patient has no history of kidney infection, kidney stone.  He cannot think of any specific trauma that might have triggered the pain, but patient works in [employer] and is always moving/lifting heavy things.  Review of system is negative for any fevers, chills.  Patient called his PCP, but was informed that there is no follow-up appointment available for several weeks, therefore he came to the emergency room.  Home Medications Prior to Admission medications   Medication Sig Start Date End Date Taking? Authorizing Provider  acetaminophen (TYLENOL) 500 MG tablet Take 1 tablet (500 mg total) by mouth every 6 (six) hours as needed. 03/07/22  Yes Camala Talwar, MD  lidocaine 4 % Place 1 patch onto the skin 2 (two) times daily. 03/07/22  Yes Varney Biles, MD  naproxen (NAPROSYN) 375 MG tablet Take 1 tablet (375 mg total) by mouth 2 (two) times daily with a meal. 03/07/22  Yes Alleen Kehm, MD  tiZANidine (ZANAFLEX) 4 MG capsule Take 1 capsule (4 mg total) by mouth 3 (three) times daily as needed for muscle spasms. 03/07/22  Yes Varney Biles, MD      Allergies    Patient has no known allergies.    Review of Systems   Review of Systems  All other systems reviewed and are negative.   Physical Exam Updated Vital Signs BP (!) 140/83   Pulse 69   Temp 98.7 F (37.1 C)    Resp 16   Ht '5\' 8"'$  (1.727 m)   Wt 86.2 kg   SpO2 96%   BMI 28.89 kg/m  Physical Exam Vitals and nursing note reviewed.  Constitutional:      Appearance: He is well-developed.  HENT:     Head: Atraumatic.  Cardiovascular:     Rate and Rhythm: Normal rate.  Pulmonary:     Effort: Pulmonary effort is normal.  Musculoskeletal:     Cervical back: Neck supple.     Comments: Patient has a large spasm in the flank region that is tender to palpation. Tenderness is reproduced with patient twisting to the left side, leaning forward and with manipulation of the spasm.   Skin:    General: Skin is warm.  Neurological:     Mental Status: He is alert and oriented to person, place, and time.     ED Results / Procedures / Treatments   Labs (all labs ordered are listed, but only abnormal results are displayed) Labs Reviewed  URINALYSIS, ROUTINE W REFLEX MICROSCOPIC - Abnormal; Notable for the following components:      Result Value   Protein, ur TRACE (*)    All other components within normal limits    EKG None  Radiology No results found.  Procedures Procedures    Medications Ordered in ED Medications  lidocaine (LIDODERM) 5 % 1 patch (1 patch  Transdermal Patch Applied 03/07/22 1944)  naproxen (NAPROSYN) tablet 375 mg (375 mg Oral Given 03/07/22 1940)  tiZANidine (ZANAFLEX) tablet 4 mg (4 mg Oral Given 03/07/22 1940)    ED Course/ Medical Decision Making/ A&P                             Medical Decision Making 67 year old male comes in with chief complaint of flank pain.  The pain has been present now for about 1 week.  He was concerned that he might have kidney infection, prompting him to come to the emergency room.  On exam, patient has very reproducible tenderness with palpation.  Exam was furnished because patient complained that the pain was worse with any level of activity and it was quite focal.  Patient has no symptoms concerning for infection.  Specifically has no  UTI-like symptoms, no GI symptoms and he has no abdominal tenderness on exam.  UA was ordered, it is negative for any acute process.  Differential diagnosis considered for this patient includes UTI/cystitis, pyelonephritis, nephrolithiasis, muscle spasms, pneumonia, PE.  Given that there is no respiratory symptoms, no cough we will not proceed with any x-ray.  We discussed with the patient nonpharmacologic remedies such as deep tissue massage, consistent stretching, warm compresses and we have also prescribed patient NSAID, Tylenol and topical lidocaine.  Patient will follow-up with his PCP if the symptoms continue over the next 2 or 3 days.    Amount and/or Complexity of Data Reviewed Labs: ordered.  Risk OTC drugs. Prescription drug management.   Final Clinical Impression(s) / ED Diagnoses Final diagnoses:  Muscle spasm of back    Rx / DC Orders ED Discharge Orders          Ordered    tiZANidine (ZANAFLEX) 4 MG capsule  3 times daily PRN        03/07/22 1955    acetaminophen (TYLENOL) 500 MG tablet  Every 6 hours PRN        03/07/22 1955    naproxen (NAPROSYN) 375 MG tablet  2 times daily with meals        03/07/22 1955    lidocaine 4 %  2 times daily        03/07/22 1955              Varney Biles, MD 03/07/22 2007

## 2022-03-07 NOTE — Discharge Instructions (Signed)
Signed emergency room for back pain.  There is clear evidence of muscle spasm as the cause for your pain.  Urine test shows no signs of infection.  As discussed, deep tissue massage, warm compresses, stretching along with some of the medications are prescribed will be helpful.  Call your PCP on Monday if you are not getting better.

## 2022-03-07 NOTE — ED Triage Notes (Signed)
Present with right flank pain stating he thinks it is he thinks it is his kidney.  Onset several days.  Denies any urination  problems

## 2022-03-07 NOTE — ED Triage Notes (Signed)
States right flank pain worse with movement.

## 2022-03-07 NOTE — ED Notes (Signed)
Patient stating they do not want to wait to be seen. Patient informed we would be glad to see them if they would like to wait. Patient declined and stated they would go to the ER instead.

## 2022-03-13 ENCOUNTER — Other Ambulatory Visit: Payer: Self-pay

## 2022-03-13 ENCOUNTER — Emergency Department (HOSPITAL_BASED_OUTPATIENT_CLINIC_OR_DEPARTMENT_OTHER)
Admission: EM | Admit: 2022-03-13 | Discharge: 2022-03-13 | Disposition: A | Discharge status: Home / Self Care | Payer: Medicare Other | Attending: Emergency Medicine | Admitting: Emergency Medicine

## 2022-03-13 ENCOUNTER — Other Ambulatory Visit (HOSPITAL_BASED_OUTPATIENT_CLINIC_OR_DEPARTMENT_OTHER): Payer: Self-pay

## 2022-03-13 DIAGNOSIS — U071 COVID-19: Secondary | ICD-10-CM | POA: Diagnosis not present

## 2022-03-13 DIAGNOSIS — R112 Nausea with vomiting, unspecified: Secondary | ICD-10-CM | POA: Diagnosis present

## 2022-03-13 LAB — CBC WITH DIFFERENTIAL/PLATELET
Abs Immature Granulocytes: 0.03 10*3/uL (ref 0.00–0.07)
Basophils Absolute: 0 10*3/uL (ref 0.0–0.1)
Basophils Relative: 0 %
Eosinophils Absolute: 0 10*3/uL (ref 0.0–0.5)
Eosinophils Relative: 0 %
HCT: 49.1 % (ref 39.0–52.0)
Hemoglobin: 17.1 g/dL — ABNORMAL HIGH (ref 13.0–17.0)
Immature Granulocytes: 1 %
Lymphocytes Relative: 17 %
Lymphs Abs: 1 10*3/uL (ref 0.7–4.0)
MCH: 31.3 pg (ref 26.0–34.0)
MCHC: 34.8 g/dL (ref 30.0–36.0)
MCV: 89.9 fL (ref 80.0–100.0)
Monocytes Absolute: 0.6 10*3/uL (ref 0.1–1.0)
Monocytes Relative: 10 %
Neutro Abs: 4.2 10*3/uL (ref 1.7–7.7)
Neutrophils Relative %: 72 %
Platelets: 223 10*3/uL (ref 150–400)
RBC: 5.46 MIL/uL (ref 4.22–5.81)
RDW: 12.1 % (ref 11.5–15.5)
WBC: 5.8 10*3/uL (ref 4.0–10.5)
nRBC: 0 % (ref 0.0–0.2)

## 2022-03-13 LAB — COMPREHENSIVE METABOLIC PANEL
ALT: 7 U/L (ref 0–44)
AST: 16 U/L (ref 15–41)
Albumin: 4.3 g/dL (ref 3.5–5.0)
Alkaline Phosphatase: 63 U/L (ref 38–126)
Anion gap: 11 (ref 5–15)
BUN: 17 mg/dL (ref 8–23)
CO2: 23 mmol/L (ref 22–32)
Calcium: 9.6 mg/dL (ref 8.9–10.3)
Chloride: 103 mmol/L (ref 98–111)
Creatinine, Ser: 0.87 mg/dL (ref 0.61–1.24)
GFR, Estimated: >60 mL/min (ref >60)
Glucose, Bld: 127 mg/dL — ABNORMAL HIGH (ref 70–99)
Potassium: 3.8 mmol/L (ref 3.5–5.1)
Sodium: 137 mmol/L (ref 135–145)
Total Bilirubin: 1.8 mg/dL — ABNORMAL HIGH (ref 0.3–1.2)
Total Protein: 7.7 g/dL (ref 6.5–8.1)

## 2022-03-13 LAB — RESP PANEL BY RT-PCR (RSV, FLU A&B, COVID)  RVPGX2
Influenza A by PCR: NEGATIVE
Influenza B by PCR: NEGATIVE
Resp Syncytial Virus by PCR: NEGATIVE
SARS Coronavirus 2 by RT PCR: POSITIVE — AB

## 2022-03-13 LAB — LIPASE, BLOOD: Lipase: 27 U/L (ref 11–51)

## 2022-03-13 MED ORDER — HYDROXYZINE HCL 25 MG PO TABS
25.0000 mg | ORAL_TABLET | Freq: Three times a day (TID) | ORAL | 0 refills | Status: AC | PRN
  Filled 2022-03-13: qty 12, 4d supply, fill #0

## 2022-03-13 MED ORDER — ACETAMINOPHEN 500 MG PO TABS
500.0000 mg | ORAL_TABLET | Freq: Four times a day (QID) | ORAL | 0 refills | Status: AC | PRN
  Filled 2022-03-13: qty 30, 8d supply, fill #0

## 2022-03-13 MED ORDER — ONDANSETRON HCL 4 MG/2ML IJ SOLN
4.0000 mg | Freq: Once | INTRAMUSCULAR | Status: AC
  Administered 2022-03-13: 4 mg via INTRAVENOUS
  Filled 2022-03-13: qty 2

## 2022-03-13 MED ORDER — HYDROXYZINE HCL 25 MG PO TABS
25.0000 mg | ORAL_TABLET | Freq: Once | ORAL | Status: AC
  Administered 2022-03-13: 25 mg via ORAL
  Filled 2022-03-13: qty 1

## 2022-03-13 MED ORDER — SODIUM CHLORIDE 0.9 % IV BOLUS
1000.0000 mL | Freq: Once | INTRAVENOUS | Status: AC
  Administered 2022-03-13: 1000 mL via INTRAVENOUS

## 2022-03-13 MED ORDER — ONDANSETRON HCL 4 MG PO TABS
4.0000 mg | ORAL_TABLET | Freq: Four times a day (QID) | ORAL | 0 refills | Status: AC
  Filled 2022-03-13: qty 12, 3d supply, fill #0

## 2022-03-13 NOTE — ED Notes (Signed)
When bringing pt d/c paperwork pt states he removed himself from monitor, BP cuff, pulse ox, and IV was removed. Discharge instructions, prescriptions and follow up care reviewed and explained, pt verbalized understanding and had no further questions on d/c. Pt caox4, ambulatory, NAD on d/c.

## 2022-03-13 NOTE — Discharge Instructions (Signed)
Recommendations for at home COVID-19 symptoms management:  Please continue isolation at home. If have acute worsening of symptoms please go to ER/urgent care for further evaluation. Check pulse oximetry and if below 90-92% please go to ER. The following supplements MAY help:  Vitamin C '500mg'$  twice a day and Quercetin 250-500 mg twice a day Vitamin D3 2000 - 4000 u/day B Complex vitamins Zinc 75-100 mg/day Melatonin 6-10 mg at night (the optimal dose is unknown) Aspirin '81mg'$ /day (if no history of bleeding issues)

## 2022-03-13 NOTE — ED Triage Notes (Signed)
Pt to ED c/o flu sx x 2 weeks, no relief with OTC medications, known sick contacts at home. C/o nausea, vomiting, diarrhea, generalizes weakness

## 2022-03-13 NOTE — ED Provider Notes (Signed)
[facility] Provider Note   CSN: [id number] Arrival date & time: 03/13/22  1110     History  Chief Complaint  Patient presents with   flu sx    Ronald Morse is a 67 y.o. male.  The history is provided by the patient and medical records. No language interpreter was used.     Patient is a 67 year old male presenting to the ED with N/V and diarrhea for two weeks. Patient also endorses a runny nose, chills and a subjective fever. Patient states he's noticed a dark-tarry stool twice within the last two weeks but no hematemesis. Patient states he can't keep anything down. Patient lives with his wife which is now presenting with similar symptoms. Daughter was in the room and states that she's given him OTC medication including Nyquil and Theraflu and has not noticed any improvement. Patient has not start any new medication and has not traveled recently. Patient states he is generally healthy. He did not get any COVID or flu vaccine. Patient denies any chest pain or shortness of breath.   Home Medications Prior to Admission medications   Medication Sig Start Date End Date Taking? Authorizing Provider  acetaminophen (TYLENOL) 500 MG tablet Take 1 tablet (500 mg total) by mouth every 6 (six) hours as needed. 03/07/22   Varney Biles, MD  lidocaine 4 % Place 1 patch onto the skin 2 (two) times daily. 03/07/22   Varney Biles, MD  naproxen (NAPROSYN) 375 MG tablet Take 1 tablet (375 mg total) by mouth 2 (two) times daily with a meal. 03/07/22   Varney Biles, MD  sertraline (ZOLOFT) 50 MG tablet Take 50 mg by mouth daily. 12/12/21   [provider]  tiZANidine (ZANAFLEX) 4 MG capsule Take 1 capsule (4 mg total) by mouth 3 (three) times daily as needed for muscle spasms. 03/07/22   Varney Biles, MD      Allergies    Patient has no known allergies.    Review of Systems   Review of Systems  All other systems reviewed and are  negative.   Physical Exam Updated Vital Signs BP (!) 158/84   Pulse 79   Temp 98.3 F (36.8 C) (Oral)   Resp 16   Ht '5\' 8"'$  (1.727 m)   Wt 86.2 kg   SpO2 98%   BMI 28.89 kg/m  Physical Exam Vitals and nursing note reviewed.  Constitutional:      General: He is not in acute distress.    Appearance: He is well-developed.  HENT:     Head: Atraumatic.     Mouth/Throat:     Mouth: Mucous membranes are moist.  Eyes:     Conjunctiva/sclera: Conjunctivae normal.  Cardiovascular:     Rate and Rhythm: Normal rate and regular rhythm.     Pulses: Normal pulses.     Heart sounds: Normal heart sounds.  Pulmonary:     Effort: Pulmonary effort is normal.     Breath sounds: Normal breath sounds. No wheezing, rhonchi or rales.  Abdominal:     Palpations: Abdomen is soft.     Tenderness: There is no abdominal tenderness.  Musculoskeletal:     Cervical back: Neck supple.  Skin:    Findings: No rash.  Neurological:     Mental Status: He is alert.     ED Results / Procedures / Treatments   Labs (all labs ordered are listed, but only abnormal results are displayed) Labs Reviewed  RESP PANEL BY RT-PCR (RSV, FLU A&B, COVID)  [id number]  CBC WITH DIFFERENTIAL/PLATELET  COMPREHENSIVE METABOLIC PANEL  LIPASE, BLOOD  URINALYSIS, ROUTINE W REFLEX MICROSCOPIC    EKG None  Radiology No results found.  Procedures Procedures    Medications Ordered in ED Medications - No data to display  ED Course/ Medical Decision Making/ A&P                             Medical Decision Making Amount and/or Complexity of Data Reviewed Labs: ordered.  Risk Prescription drug management.   BP (!) 158/84   Pulse 79   Temp 98.3 F (36.8 C) (Oral)   Resp 16   Ht '5\' 8"'$  (1.727 m)   Wt 86.2 kg   SpO2 98%   BMI 28.89 kg/m   59:76 PM 67 year old male presenting with flulike symptoms.  Patient report for the past 2-week he has has sinus congestion, sore throat, cough, sneezing as well as  having persistent nausea vomiting diarrhea.  Nausea and vomiting seems to be improving but he still having some loose stools and not having much of an appetite.  He feels a bit dehydrated and tired.  He did notice 2 episodes of slightly dark tarry stool throughout the past 2 weeks as well as occasional bright red blood per rectum but states that he has history of hemorrhoid unsure if is related to that.  Denies any rectal pain.  His wife recently also developed similar symptoms.  He has tried over-the-counter medication at home including DayQuil, TheraFlu without adequate relief.  His symptoms moderate in severity prompting this ER visit.  Denies any chest pain or shortness of breath  On exam, this is a well-appearing elderly male appears to be in no acute discomfort.  Heart with normal rate and rhythm, lungs clear to auscultation bilaterally abdomen is soft without any focal tenderness.  He has normal skin turgor  Vital sign review remarkable for elevated blood pressure 158/84.  Patient is afebrile, no hypoxia.  -Labs ordered, independently viewed and interpreted by me.  Labs remarkable for covid positive, pt is outside the window to treat with Paxlovid. Will lprovide supportive care -The patient was maintained on a cardiac monitor.  I personally viewed and interpreted the cardiac monitored which showed an underlying rhythm of: NSR -Imaging including CXR considered but not performed as pt denies productive cough, fever or sob to suggest pna -This patient presents to the ED for concern of cold sxs, this involves an extensive number of treatment options, and is a complaint that carries with it a high risk of complications and morbidity.  The differential diagnosis includes covid, flu, rsv, viral gi, gastroenteritis, colitis, diverticular disease -Co morbidities that complicate the patient evaluation includes none -Treatment includes IVF, atarax, zofran -Reevaluation of the patient after these medicines  showed that the patient improved -PCP office notes or outside notes reviewed -Discussion with attending Dr. Tarry Kos -Escalation to admission/observation considered: patients feels much better, is comfortable with discharge, and will follow up with PCP -Prescription medication considered, patient comfortable with supportive care -Social Determinant of Health considered which includes tobacco use         Final Clinical Impression(s) / ED Diagnoses Final diagnoses:  COVID-19 virus infection    Rx / DC Orders ED Discharge Orders          Ordered    ondansetron (ZOFRAN) 4 MG tablet  Every 6 hours  03/13/22 1439    acetaminophen (TYLENOL) 500 MG tablet  Every 6 hours PRN        03/13/22 1439    hydrOXYzine (ATARAX) 25 MG tablet  Every 8 hours PRN        03/13/22 1439              Domenic Moras, PA-C 03/13/22 1439    Elgie Congo, MD 03/15/22 1015

## 2022-03-13 NOTE — ED Notes (Signed)
Pt attempted to provide urine sample, unable to urinate at this time.

## 2023-04-01 ENCOUNTER — Other Ambulatory Visit: Payer: Self-pay | Admitting: Family Medicine

## 2023-04-01 DIAGNOSIS — G4489 Other headache syndrome: Secondary | ICD-10-CM

## 2023-04-01 DIAGNOSIS — M5412 Radiculopathy, cervical region: Secondary | ICD-10-CM

## 2023-04-13 ENCOUNTER — Ambulatory Visit
Admission: RE | Admit: 2023-04-13 | Discharge: 2023-04-13 | Disposition: A | Discharge status: Home / Self Care | Source: Ambulatory Visit | Attending: Family Medicine | Admitting: Family Medicine

## 2023-04-13 DIAGNOSIS — M5412 Radiculopathy, cervical region: Secondary | ICD-10-CM

## 2023-04-13 DIAGNOSIS — G4489 Other headache syndrome: Secondary | ICD-10-CM
